# Patient Record
Sex: Male | Born: 1985 | Race: White | Hispanic: No | Marital: Single
Health system: Southern US, Community
[De-identification: ages and names within clinical notes are randomized; demographics above are authoritative.]

---

## 2014-05-13 ENCOUNTER — Emergency Department: Payer: Self-pay | Admitting: Emergency Medicine

## 2014-05-13 LAB — URINALYSIS, COMPLETE
Bilirubin,UR: NEGATIVE
Blood: NEGATIVE
GLUCOSE, UR: NEGATIVE mg/dL (ref 0–75)
KETONE: NEGATIVE
Leukocyte Esterase: NEGATIVE
NITRITE: NEGATIVE
PH: 6 (ref 4.5–8.0)
Protein: NEGATIVE
Specific Gravity: 1.002 (ref 1.003–1.030)
Squamous Epithelial: <1
WBC UR: NONE SEEN /HPF (ref 0–5)

## 2014-05-13 LAB — DRUG SCREEN, URINE

## 2014-05-13 LAB — SALICYLATE LEVEL: Salicylates, Serum: <1.7 mg/dL

## 2014-05-13 LAB — COMPREHENSIVE METABOLIC PANEL
ALBUMIN: 3.9 g/dL (ref 3.4–5.0)
ALK PHOS: 142 U/L — AB
ALT: 23 U/L
Anion Gap: 11 (ref 7–16)
BILIRUBIN TOTAL: 0.4 mg/dL (ref 0.2–1.0)
BUN: 7 mg/dL (ref 7–18)
Calcium, Total: 8.2 mg/dL — ABNORMAL LOW (ref 8.5–10.1)
Chloride: 104 mmol/L (ref 98–107)
Co2: 25 mmol/L (ref 21–32)
Creatinine: 0.8 mg/dL (ref 0.60–1.30)
EGFR (African American): >60
EGFR (Non-African Amer.): >60
Glucose: 87 mg/dL (ref 65–99)
Osmolality: 277 (ref 275–301)
Potassium: 3.4 mmol/L — ABNORMAL LOW (ref 3.5–5.1)
SGOT(AST): 33 U/L (ref 15–37)
Sodium: 140 mmol/L (ref 136–145)
TOTAL PROTEIN: 6.6 g/dL (ref 6.4–8.2)

## 2014-05-13 LAB — CBC
HCT: 47.4 % (ref 40.0–52.0)
HGB: 15.8 g/dL (ref 13.0–18.0)
MCH: 30.9 pg (ref 26.0–34.0)
MCHC: 33.3 g/dL (ref 32.0–36.0)
MCV: 93 fL (ref 80–100)
Platelet: 292 10*3/uL (ref 150–440)
RBC: 5.1 10*6/uL (ref 4.40–5.90)
RDW: 18.5 % — ABNORMAL HIGH (ref 11.5–14.5)
WBC: 7.2 10*3/uL (ref 3.8–10.6)

## 2014-05-13 LAB — ETHANOL: ETHANOL LVL: 100 mg/dL

## 2014-05-13 LAB — ACETAMINOPHEN LEVEL: Acetaminophen: 2 ug/mL — ABNORMAL LOW

## 2014-05-13 LAB — MAGNESIUM: Magnesium: 2.2 mg/dL

## 2014-05-13 LAB — LIPASE, BLOOD: LIPASE: 75 U/L (ref 73–393)

## 2014-05-15 ENCOUNTER — Inpatient Hospital Stay: Payer: Self-pay | Admitting: Psychiatry

## 2014-05-15 LAB — ETHANOL: ETHANOL LVL: 81 mg/dL

## 2014-05-15 LAB — LIPASE, BLOOD: LIPASE: 76 U/L (ref 73–393)

## 2014-05-29 ENCOUNTER — Emergency Department: Payer: Self-pay | Admitting: Internal Medicine

## 2014-05-29 LAB — URINALYSIS, COMPLETE
Bacteria: NONE SEEN
Bilirubin,UR: NEGATIVE
Blood: NEGATIVE
Glucose,UR: NEGATIVE mg/dL (ref 0–75)
KETONE: NEGATIVE
LEUKOCYTE ESTERASE: NEGATIVE
NITRITE: NEGATIVE
PH: 6 (ref 4.5–8.0)
Protein: NEGATIVE
RBC, UR: NONE SEEN /HPF (ref 0–5)
SPECIFIC GRAVITY: 1.003 (ref 1.003–1.030)
Squamous Epithelial: 1
WBC UR: NONE SEEN /HPF (ref 0–5)

## 2014-05-29 LAB — COMPREHENSIVE METABOLIC PANEL
ALBUMIN: 3.7 g/dL (ref 3.4–5.0)
ALK PHOS: 143 U/L — AB
ALT: 34 U/L
AST: 33 U/L (ref 15–37)
Anion Gap: 6 — ABNORMAL LOW (ref 7–16)
BILIRUBIN TOTAL: 0.2 mg/dL (ref 0.2–1.0)
BUN: 9 mg/dL (ref 7–18)
CHLORIDE: 101 mmol/L (ref 98–107)
Calcium, Total: 8.3 mg/dL — ABNORMAL LOW (ref 8.5–10.1)
Co2: 29 mmol/L (ref 21–32)
Creatinine: 0.94 mg/dL (ref 0.60–1.30)
Glucose: 83 mg/dL (ref 65–99)
Osmolality: 270 (ref 275–301)
Potassium: 4.2 mmol/L (ref 3.5–5.1)
Sodium: 136 mmol/L (ref 136–145)
Total Protein: 6.7 g/dL (ref 6.4–8.2)

## 2014-05-29 LAB — DRUG SCREEN, URINE
AMPHETAMINES, UR SCREEN: NEGATIVE (ref ?–1000)
BARBITURATES, UR SCREEN: NEGATIVE (ref ?–200)
Benzodiazepine, Ur Scrn: NEGATIVE (ref ?–200)
COCAINE METABOLITE, UR ~~LOC~~: NEGATIVE (ref ?–300)
Cannabinoid 50 Ng, Ur ~~LOC~~: NEGATIVE (ref ?–50)
MDMA (ECSTASY) UR SCREEN: NEGATIVE (ref ?–500)
Methadone, Ur Screen: NEGATIVE (ref ?–300)
Opiate, Ur Screen: NEGATIVE (ref ?–300)
Phencyclidine (PCP) Ur S: NEGATIVE (ref ?–25)
Tricyclic, Ur Screen: NEGATIVE (ref ?–1000)

## 2014-05-29 LAB — CBC
HCT: 47.3 % (ref 40.0–52.0)
HGB: 16 g/dL (ref 13.0–18.0)
MCH: 30.9 pg (ref 26.0–34.0)
MCHC: 33.7 g/dL (ref 32.0–36.0)
MCV: 92 fL (ref 80–100)
PLATELETS: 325 10*3/uL (ref 150–440)
RBC: 5.17 10*6/uL (ref 4.40–5.90)
RDW: 17.7 % — ABNORMAL HIGH (ref 11.5–14.5)
WBC: 10.6 10*3/uL (ref 3.8–10.6)

## 2014-05-29 LAB — ACETAMINOPHEN LEVEL: Acetaminophen: <2 ug/mL

## 2014-05-29 LAB — ETHANOL: Ethanol: 115 mg/dL

## 2014-05-29 LAB — SALICYLATE LEVEL: Salicylates, Serum: 1.7 mg/dL

## 2014-05-30 LAB — OCCULT BLOOD X 1 CARD TO LAB, STOOL: Occult Blood, Feces: NEGATIVE

## 2014-06-04 ENCOUNTER — Emergency Department: Payer: Self-pay | Admitting: Emergency Medicine

## 2014-06-09 LAB — COMPREHENSIVE METABOLIC PANEL
ALBUMIN: 4.4 g/dL (ref 3.4–5.0)
ANION GAP: 8 (ref 7–16)
Alkaline Phosphatase: 141 U/L — ABNORMAL HIGH
BILIRUBIN TOTAL: 0.5 mg/dL (ref 0.2–1.0)
BUN: 13 mg/dL (ref 7–18)
CALCIUM: 9.3 mg/dL (ref 8.5–10.1)
CREATININE: 0.93 mg/dL (ref 0.60–1.30)
Chloride: 100 mmol/L (ref 98–107)
Co2: 28 mmol/L (ref 21–32)
EGFR (Non-African Amer.): >60
GLUCOSE: 122 mg/dL — AB (ref 65–99)
OSMOLALITY: 273 (ref 275–301)
POTASSIUM: 3.8 mmol/L (ref 3.5–5.1)
SGOT(AST): 32 U/L (ref 15–37)
SGPT (ALT): 43 U/L
Sodium: 136 mmol/L (ref 136–145)
Total Protein: 7.9 g/dL (ref 6.4–8.2)

## 2014-06-09 LAB — DRUG SCREEN, URINE
AMPHETAMINES, UR SCREEN: NEGATIVE (ref ?–1000)
BARBITURATES, UR SCREEN: NEGATIVE (ref ?–200)
Benzodiazepine, Ur Scrn: NEGATIVE (ref ?–200)
Cannabinoid 50 Ng, Ur ~~LOC~~: NEGATIVE (ref ?–50)
Cocaine Metabolite,Ur ~~LOC~~: NEGATIVE (ref ?–300)
MDMA (ECSTASY) UR SCREEN: NEGATIVE (ref ?–500)
Methadone, Ur Screen: NEGATIVE (ref ?–300)
Opiate, Ur Screen: NEGATIVE (ref ?–300)
PHENCYCLIDINE (PCP) UR S: NEGATIVE (ref ?–25)
Tricyclic, Ur Screen: NEGATIVE (ref ?–1000)

## 2014-06-09 LAB — URINALYSIS, COMPLETE
BILIRUBIN, UR: NEGATIVE
Bacteria: NONE SEEN
Blood: NEGATIVE
Glucose,UR: NEGATIVE mg/dL (ref 0–75)
Hyaline Cast: 5
KETONE: NEGATIVE
LEUKOCYTE ESTERASE: NEGATIVE
Nitrite: NEGATIVE
Ph: 6 (ref 4.5–8.0)
Protein: 30
RBC,UR: <1 /HPF (ref 0–5)
SPECIFIC GRAVITY: 1.026 (ref 1.003–1.030)
Squamous Epithelial: 2
WBC UR: 1 /HPF (ref 0–5)

## 2014-06-09 LAB — CBC
HCT: 52 % (ref 40.0–52.0)
HGB: 17.7 g/dL (ref 13.0–18.0)
MCH: 30.8 pg (ref 26.0–34.0)
MCHC: 34.1 g/dL (ref 32.0–36.0)
MCV: 90 fL (ref 80–100)
Platelet: 368 10*3/uL (ref 150–440)
RBC: 5.76 10*6/uL (ref 4.40–5.90)
RDW: 16.6 % — AB (ref 11.5–14.5)
WBC: 8.8 10*3/uL (ref 3.8–10.6)

## 2014-06-09 LAB — LIPASE, BLOOD: Lipase: 80 U/L (ref 73–393)

## 2014-06-09 LAB — SALICYLATE LEVEL: Salicylates, Serum: <1.7 mg/dL

## 2014-06-09 LAB — ETHANOL: ETHANOL LVL: 106 mg/dL

## 2014-06-09 LAB — ACETAMINOPHEN LEVEL

## 2014-06-10 ENCOUNTER — Inpatient Hospital Stay: Payer: Self-pay | Admitting: Psychiatry

## 2014-11-02 NOTE — Discharge Summary (Signed)
PATIENT NAME:  Henry Johnston, Henry Johnston MR#:  [id number] DATE OF BIRTH:  [birth year]  DATE OF ADMISSION:  05/15/2014 DATE OF DISCHARGE:  05/20/2014  IDENTIFYING INFORMATION: The patient is a 29 year old single Caucasian male on disability who currently lives in [place], [place].   CHIEF COMPLAINT: "I had nausea and vomiting for 7 days."   DISCHARGE DIAGNOSES:  1.  Alcohol use disorder, severe.  2.  Alcohol withdrawal without perceptual disturbances.  3.  Hypertension. 4.  Corneal abrasion.   5.  Gastroesophageal reflux disease.   6.Gastric ulcer  DISCHARGE MEDICATIONS: Lisinopril 10 mg p.o. daily, Protonix 40 mg p.o. b.i.d., and Zyrtec 10 mg p.o. daily for allergies.    HOSPITAL COURSE: The patient presented to the Emergency Department on November 4 due to nausea and vomiting. He reported that he had consumed large amounts of alcohol for 7 days in a row and was having nonstop nausea and vomiting. The patient reported a long history of alcohol dependence. He reported abusing alcohol heavily for the last 3 years. The patient has not had any significant episodes of abstinence in 3 years, he usually drinks about a 12 pack of beer a day plus liquor up to 5 pints of liquor a day. The patient was admitted to [facility] unit in order to address the high alcohol withdrawal and prevent medical complications. He was started on a benzodiazepine taper. CIWA scores were checked every 8 hours and his vital signs were checked every 8 hours. The patient had an uncomplicated alcohol detoxification. The patient did not have any episodes of nausea and vomiting during his stay in the unit. Appetite, energy, and concentration were stable. Mood was euthymic. The patient at no time reported suicidality, homicidality, or psychosis. This was an uneventful hospitalization. The patient did not have any behavioral problems during his stay. There were no medical complications reported. On the day of the discharge  the patient reported euthymic mood. Denied  suicidality, homicidality, or psychosis. He denied problems with sleep, appetite, energy, or concentration. He denied having any physical complaints and denied having any side effects from his medications.   CONSULTATIONS:  At arrival the patient reported having a fall. Head CT reported no abnormalities, however he did appear to have a corneal abrasion. Ophthalmology was consulted and they recommended for the patient to have ophthalmic neomycin, which he completed during his stay in the inpatient hospital. No other recommendations were given.   MENTAL STATUS EXAMINATION: The patient is a 29 year old Caucasian male who appears his stated age. He displays fair grooming and hygiene. Behavior, he was pleasant and cooperative. Eye contact was within normal range. Psychomotor activity was within normal range. His speech had regular tone, volume, and rate. Thought process is linear. Thought content negative for suicidality, homicidality, or psychosis. Mood euthymic, affect reactive. Insight and judgment fair. Attention and concentration appear to be average, however it was not formally tested. Fund of knowledge appears to be average.  Cognitive examination, the patient is alert and oriented in person, place, time, and situation.   LABORATORY RESULTS: BUN 7, creatinine 0.8, sodium 140, potassium 3.4, calcium 8.2. Lipase 76. AST 33, ALT 23. Alcohol at admission was 100. Urine toxicology screen was negative. WBC 7.2, hemoglobin 15.9, hematocrit 47.4, platelet count 292,000.  UA clear. Acetaminophen and salicylate levels were below detection limit.   DISCHARGE DISPOSITION: The patient will return to his home where he lives with a friend and his friend's father in [place].   DISCHARGE FOLLOWUP: The  patient will be set up to follow up with [facility] and he will start receiving CST through [facility]. He has an appointment with them on 05/22/2014 at 7:00 a.m., fax number  [phone] and telephone number [phone].     ____________________________ Jimmy Footman, MD ahg:bu D: 05/20/2014 16:17:56 ET T: 05/20/2014 18:43:43 ET JOB#: [id number]  cc: Jimmy Footman, MD, <Dictator> Horton Chin MD ELECTRONICALLY SIGNED 05/22/2014 14:43
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
## 2014-11-02 NOTE — Consult Note (Signed)
Brief Consult Note: Diagnosis: alcohol abuse.   Patient was seen by consultant.   Consult note dictated.   Orders entered.   Comments: Psychiatry: Patient seen. Chart reviewed. Patient with alcohol abuse agagin drinking heavily with medical complications. [facility] sugggests [facility]. Will start detox orders and we will refer him to [facility].  Electronic Signatures: Audery Amellapacs, Zamir Staples T (MD)  (Signed [phone]-Nov-15 12:45)  Authored: Brief Consult Note   Last Updated: 19-Nov-15 12:45 by Audery Amellapacs, Maddison Kilner T (MD)

## 2014-11-02 NOTE — Consult Note (Signed)
PATIENT NAME:  Henry Johnston, Henry Johnston MR#:  [id number] DATE OF BIRTH:  Nov 14, 1985  DATE OF CONSULTATION:  05/15/2014  REFERRING PHYSICIAN:   CONSULTING PHYSICIAN:  Mirely Pangle H. Allena Katz, MD  PRIMARY CARE PROVIDER: None.   REFERRING PHYSICIAN:  Consult requested by Dr. Toni Amend.    REASON FOR CONSULTATION: Accelerated hypertension.   HISTORY OF PRESENT ILLNESS: The patient is a 29 year old white male who was admitted for alcohol abuse to the behavioral medicine unit. He does have a history of hypertension, according to him he has had a blood pressure all of his life. He was on HCTZ which he stopped a few months back, who was noted to have blood pressure initially of 159/126, but he has received a dose of lisinopril and the blood pressure currently is 139/94. The patient otherwise denies any headaches. No chest pains or shortness of breath. The patient also fell on concrete yesterday on the left side of his face and he has been having tearing from his left eye. He denies any pain in the eye. Denies any fevers or chills. No nausea, vomiting, or diarrhea.   PAST MEDICAL HISTORY:  1.  Significant for fatty liver.  2.  History of hypertension.  3.  GERD.   PAST SURGICAL HISTORY: None.   ALLERGIES: None.   HOME MEDICATIONS:  Pepcid 20 mg 1 tab p.o. b.i.d., Zofran 4 mg 3 times a day as needed.   SOCIAL HISTORY: Does not smoke. Drinks about 6 packs of beer daily. No drug use.   FAMILY HISTORY: Positive for hypertension. Positive for alcohol abuse.   REVIEW OF SYSTEMS:  CONSTITUTIONAL: Denies any fevers, fatigue. There is no weight loss, no weight gain. EYES: Complains of left eye redness and drainage since this fall yesterday. No glaucoma. No cataracts.  EARS, NOSE, AND THROAT: No tinnitus. No ear pain. No hearing loss. No seasonal or year-round allergies. No epistaxis. No nasal discharge. No difficulty swallowing.  RESPIRATORY: Denies any cough, wheezing, hemoptysis. No COPD, no TB.   CARDIOVASCULAR:  Denies any chest pain, orthopnea, edema, or arrhythmia.  GASTROINTESTINAL: No nausea, vomiting, diarrhea. No abdominal pain. No hematemesis. No melena. No ulcer.  Has GERD. No IBS. No jaundice.  GENITOURINARY: Denies any dysuria, hematuria, renal calculus, or frequency.  ENDOCRINE: Denies any polyuria, nocturia, or thyroid problems.  HEMATOLOGIC AND LYMPHATIC: Denies anemia, easy bruisability, or bleeding.  SKIN: No acne. No rash.  MUSCULOSKELETAL: Denies any pain in the neck, back, or shoulder.  NEUROLOGIC: No numbness, CVA, TIA, or seizures.  PSYCHIATRIC: Denies any anxiety, insomnia, or ADD.   PHYSICAL EXAMINATION:  VITAL SIGNS: Temperature 98.1, pulse 96, respirations 18, blood pressure 139/94.  GENERAL: The patient is a well-developed male in no acute distress.  HEENT: Head atraumatic, normocephalic. Pupils equally round, reactive to light and accommodation.  The left eye has got erythema and watery drainage. Nasal exam shows no drainage or ulceration. Oropharynx is clear without any exudate.  NECK: Supple without any thyromegaly.  CARDIOVASCULAR: Regular rate and rhythm. No murmurs, rubs, clicks, or gallops.  LUNGS: Clear to auscultation bilaterally without any rales, rhonchi, wheezing.  ABDOMEN: Soft, nontender, nondistended. Positive bowel sounds x 4.  EXTREMITIES: No clubbing, cyanosis, edema.  SKIN: No rash.  LYMPHATICS: No lymph nodes palpable.  VASCULAR: Good DP, PT pulses.  PSYCHIATRIC: Not anxious or depressed.   NEUROLOGIC:  Awake, alert, oriented x 3. No focal deficits.    LABORATORY DATA:  Done on November 2, glucose 87, BUN 7, creatinine 0.80, sodium 140, potassium 3.4,  chloride 104, CO2 of 25, calcium 8.2. LFTs were normal except alkaline phosphatase of 142. Toxic urine drug screen was negative. WBC 7.2, hemoglobin 15.8, platelet count 292,000. CT scan of the head without contrast shows no acute intracranial abnormalities.  ASSESSMENT AND PLAN: The patient is a  29 year old white male with history of alcohol abuse, hypertension, GERD, admitted for detox.   1.  Accelerated hypertension. Blood pressure is currently improved. Continue lisinopril and p.r.n. clonidine. We will adjust his medications as needed.  2.  Left eye injury with possible corneal abrasion. Ophthalmology consult and further treatment based on that.  3.  Gastroesophageal reflux disease. We will continue on Protonix.  4.  Miscellaneous. The patient is ambulatory.    TIME SPENT: 45 minutes on this consult.     ____________________________ Lacie ScottsShreyang H. Allena KatzPatel, MD shp:bu D: 05/15/2014 14:20:57 ET T: 05/15/2014 14:34:15 ET JOB#: [id number]  cc: Khaniyah Bezek H. Allena KatzPatel, MD, <Dictator> Charise CarwinSHREYANG H Andelyn Spade MD ELECTRONICALLY SIGNED 05/29/2014 19:26

## 2014-11-02 NOTE — Consult Note (Signed)
PATIENT NAME:  Henry Johnston, [name] MR#:  [id number] DATE OF BIRTH:  10/15/1985  DATE OF CONSULTATION:  05/15/2014  REFERRING PHYSICIAN:   CONSULTING PHYSICIAN:  Audery AmelJohn T. Seniyah Esker, MD  IDENTIFYING INFORMATION AND REASON FOR CONSULT: A 29 year old man with a history of alcohol abuse, comes to the hospital.   CHIEF COMPLAINT: "I've been throwing up from alcohol."   HISTORY OF PRESENT ILLNESS: Information obtained from the patient and the chart. This is the second time in a week he has come to the Emergency Room with a similar complaint. He said that he had been sick to his stomach and throwing up for several days. The nausea has subsided, but he says that it is because he has been drinking heavily. He estimates about a 6-pack of beer a day and that he has been doing that for several years. He did have a detoxification episode in August at [facility], but did not stay sober afterwards. He does not have a history of seizures. Does not have a history of DTs. Does get shaky when he stops drinking. Mood has been somewhat down and dysphoric, but he denies any suicidal ideation. No other major stressor identified.   PAST PSYCHIATRIC HISTORY: Only psychiatric treatment that he identifies was being in [facility] for detoxification in August of this year. Otherwise, has never seen a psychiatrist, never been in a hospital. No history of suicide attempts.   SUBSTANCE ABUSE HISTORY: Years of heavy drinking without any sustained sobriety that he reports. No history of seizures. No history of DTs. Denies that he abuses any other drugs.   SOCIAL HISTORY: The patient gets disability. Says he lives with some friends. Does not work. Does not do much.   PAST MEDICAL HISTORY: Apparently has some kind of back pain for which he has gotten disability. Also has high blood pressure. Also has acid reflux.   MEDICATIONS: Does not take any medicine.   ALLERGIES: No known drug allergies.   SUBSTANCE ABUSE HISTORY: As noted above.    FAMILY HISTORY: Says he has an extensive family history of substance abuse.   REVIEW OF SYSTEMS: Feeling shaky and sick to his stomach. Mildly dysphoric. No suicidal ideation. No hallucinations. The rest of the review of systems all negative.   MENTAL STATUS EXAMINATION: Disheveled gentleman who looks his stated age, cooperative with the interview. Eye contact good. Psychomotor activity a little bit shaky. Speech is decreased in total amount and quiet. Affect is blunted. Mood stated as being nervous. Thoughts are lucid, but slow. No evidence of loosening of associations or delusions. Denies auditory or visual hallucinations. Denies suicidal or homicidal ideation. He is alert and oriented x4. Remembers 3/3 objects immediately and at 3 minutes. Judgment and insight adequate. Baseline intelligence and fund of knowledge normal.   LABORATORY RESULTS: CT of the head done because of a recent fall. Result is no acute finding.   Alcohol level was 81 on presentation.   Lipase normal. The chemistry panel is normal except for a slightly low potassium at 3.4. CBC unremarkable. Urinalysis normal. Drug screen negative.   VITAL SIGNS: Blood pressure currently 158/108, respirations 18, pulse 96, temperature 98.   ASSESSMENT: A 29 year old man with alcohol dependence, requesting detoxification. Elevated vital signs. Repeated attempts to try and stop outpatient without success. The patient is reasonable for admission to the hospital for safety for alcohol withdrawal.   TREATMENT PLAN: Admit voluntarily. Suicide and fall precautions and seizure precautions. Order detoxification medicines. I will start him on a low  dose of lisinopril for his blood pressure. We can order a medicine consult for his blood pressure. Engage him in groups with further decisions made by the floor.   DIAGNOSIS, PRINCIPAL AND PRIMARY:  AXIS I: Alcohol abuse, severe.   SECONDARY DIAGNOSES:  AXIS I: No further.  AXIS II: Deferred.  AXIS  III: High blood pressure, history of chronic back pain.   ____________________________ Audery Amel, MD jtc:JT D: 05/15/2014 13:24:42 ET T: 05/15/2014 13:40:44 ET JOB#: [id number]  cc: Audery Amel, MD, <Dictator> Audery Amel MD ELECTRONICALLY SIGNED 05/18/2014 14:59

## 2014-11-02 NOTE — Consult Note (Signed)
PATIENT NAME:  Henry Johnston, Henry Johnston MR#:  [id number] DATE OF BIRTH:  1986/06/08  DATE OF CONSULTATION:  05/30/2014  REFERRING PHYSICIAN:   CONSULTING PHYSICIAN:  Audery Amel, MD  IDENTIFYING INFORMATION AND REASON FOR CONSULTATION: A 29 year old man who came to the Emergency Room requesting detox. Consultation because he is not eligible to go to [facility].   HISTORY OF PRESENT ILLNESS: Information obtained from the patient and the chart. This 29 year old man comes in stating that he has been drinking heavily every day. He was unreliable in giving the amount that he was drinking. He told me initially that he had only been drinking 2 beers yesterday and that he drank an average of a 6 pack most days. He then turned around and told me that yesterday he had had much more than that and at one point told me he was drinking a 12 pack of beer a day. He denies that he is abusing any other drugs. He was just in the hospital earlier this month and was discharged just 9 days ago after a stay for detox and alcohol abuse. The patient says that he did not stay sober for even a single day after leaving the hospital. He felt like his cravings were too strong and he had to go back to drinking. Drinking causes him to have nausea and vomiting, which is his primary complaint about it. He says that when he is drinking he continues to vomit and has not been able to eat anything for days. He reports that he was having some blood in his vomiting earlier. He does not describe himself as depressed and denies suicidal ideation and denies any psychotic symptoms. He describes his mood as being "moody."   PAST PSYCHIATRIC HISTORY: Just discharged from the hospital 9 days ago after detox. He is a known client at [facility], but has not been following up there. He did not stay sober for even a single day after discharge. The patient is on disability for a diagnosis of what he calls "bipolar disorder and schizophrenia". He is unable to describe any  symptoms consistent with that and has not been on any medication for them in as long as he can remember. No history of suicide attempts. No history that he reports of violence. He has been hospitalized a few times over the last couple of years it sounds like, probably at [facility] and at [facility], but he says all of those were about his drinking.   SUBSTANCE ABUSE HISTORY: Says he has been drinking heavily for about 3 years. Claims he has not been able to stay sober for even a single day outside the hospital. He gets too shaky and tremulous. He talks about being afraid that he is going to have a "seizure", but he is not describing ever having actually had seizures or delirium tremens. Denies that he abuses any other drugs.   PAST MEDICAL HISTORY: The patient apparently has been given a diagnosis of an ulcer and takes Protonix regularly, also takes lisinopril for blood pressure.   FAMILY HISTORY: Reports that his mother has an alcohol problem and he has a brother who also has chronic mental illness.   SOCIAL HISTORY: The patient gets disability. Does not work. Lives with some family friends who nevertheless seem to look after him pretty closely. He says that he is pretty much estranged from his family.   REVIEW OF SYSTEMS: Still feeling sick to his stomach, but has not been vomiting today. Not feeling depressed. Not  feeling suicidal. No hallucinations. No delusions. No other specific complaints.   MENTAL STATUS EXAMINATION: Adequately groomed man who looks his stated age, cooperative with the interview. He is very passive in his interaction. Answers questions very simply and often in a stereotyped manner. Eye contact decreased. Psychomotor activity slow. Speech decreased in total amount. Affect is somewhat blunted. Mood stated as being moody. Thoughts appear to be slow and a bit concrete. No evidence of delusional thinking. Denies hallucinations. Denies suicidal or homicidal ideation. Can remember 3  out of 3 objects immediately and at 3 minutes. He is alert and oriented x4. Judgment and insight at least at the moment seems to be adequate. Baseline intelligence and fund of knowledge appears to be in the normal range.   LABORATORY RESULTS: Salicylates and acetaminophen negative. Alcohol level was 115. Calcium level low at 8.3. Elevated alkaline phosphatase 143. Other liver enzymes unremarkable. CBC all normal. Urinalysis unremarkable. Drug screen negative.   VITAL SIGNS: His blood pressure is currently 132/96, pulse 77, respirations 18, temperature 97.8.   ASSESSMENT: A [age] year old man with alcohol abuse, severe. Not a very good historian, but it sounds like he has been trying to stop drinking for a while because of his stomach problems, but has not been able to do it outpatient and is not really following up with outpatient treatment. The patient is requesting further detox and substance abuse treatment. We have gotten a communication from the liaison from [facility] that they are [facility] because of his failure to follow up with outpatient treatment. He apparently really does have this chronic nausea and vomiting problem and they are afraid for his health.   TREATMENT PLAN: The patient is on detoxification orders right now. Other than detoxification, not likely to benefit from any specific treatment inside our hospital. We are going to [facility] and try and see if we can get him transported there. The patient understands and is agreeable.   DIAGNOSIS, PRINCIPAL AND PRIMARY:  AXIS I: Alcohol abuse, severe.   SECONDARY DIAGNOSES: AXIS I: History of bipolar disorder by past history.  AXIS II: Deferred.  AXIS III: High blood pressure, history of gastric reflux or possible peptic ulcer. ____________________________ Audery AmelJohn T. Jamarie Mussa, MD jtc:sb D: 05/30/2014 13:37:49 ET T: 05/30/2014 13:48:22  ET JOB#: [id number]  cc: Audery AmelJohn T. Verne Cove, MD, <Dictator> Audery AmelJOHN T Leigha Olberding MD ELECTRONICALLY SIGNED 06/21/2014 19:08
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
## 2016-02-01 IMAGING — CT CT ABD-PELV W/ CM
2 of 4 series · 16 of 46 positions shown, 18 images · IV contrast (isovue)
Comparison: None.

CLINICAL DATA: Generalized abdominal pain for 5 days.

EXAM:
CT ABDOMEN AND PELVIS WITH CONTRAST
TECHNIQUE: Multidetector CT imaging of the abdomen and pelvis was performed
using the standard protocol following bolus administration of
intravenous contrast.
CONTRAST:  100 cc Isovue 300

[Series 2: routine abd pel with · axial · 0.78mm/px · z∈[-507,-27]mm · 13 of 106 slices shown, 15 images]
[im 5/106  soft-tissue]
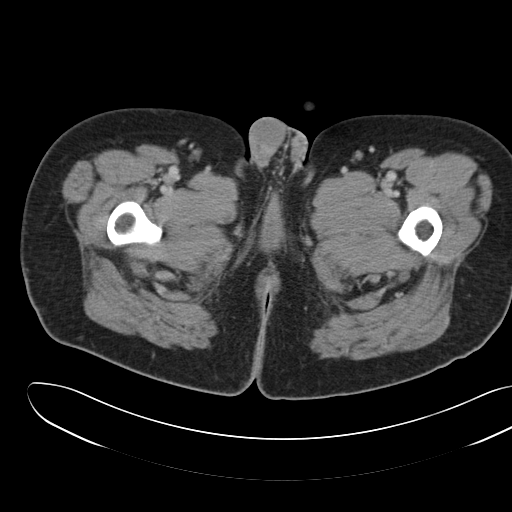
[im 5/106  bone]
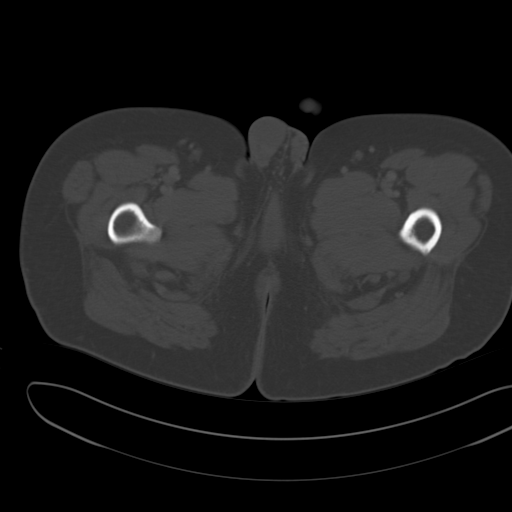
[im 13/106  soft-tissue]
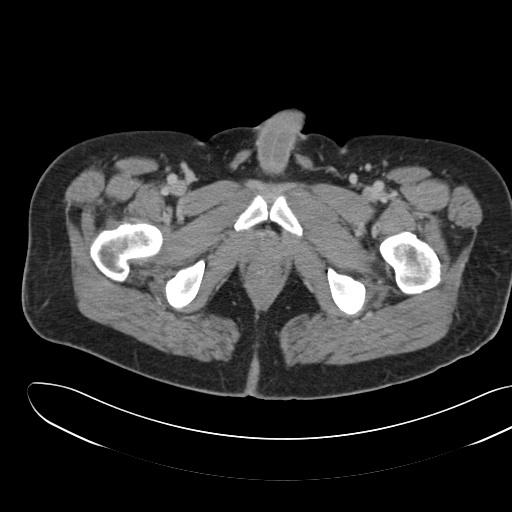
[im 22/106  soft-tissue]
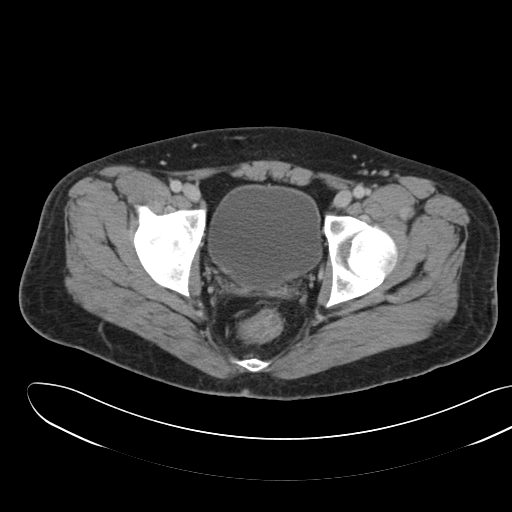
[im 30/106  soft-tissue]
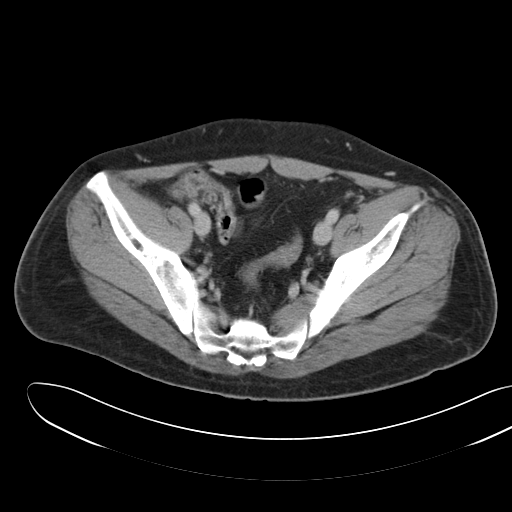
[im 38/106  soft-tissue]
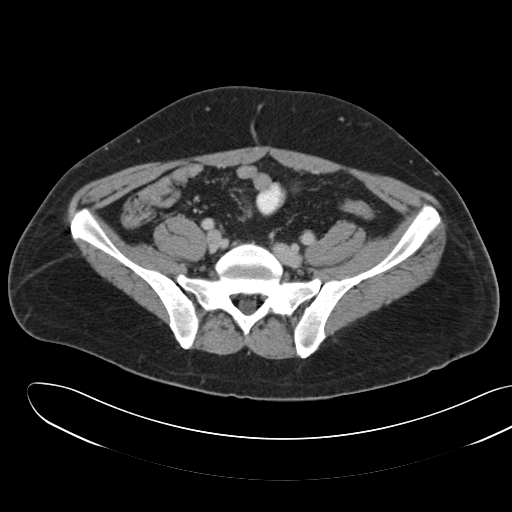
[im 47/106  soft-tissue]
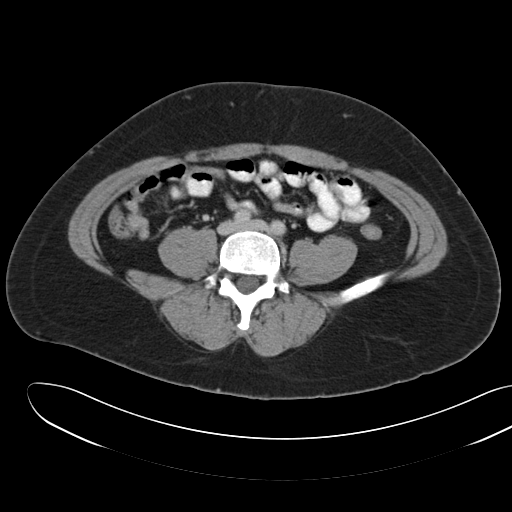
[im 55/106  soft-tissue]
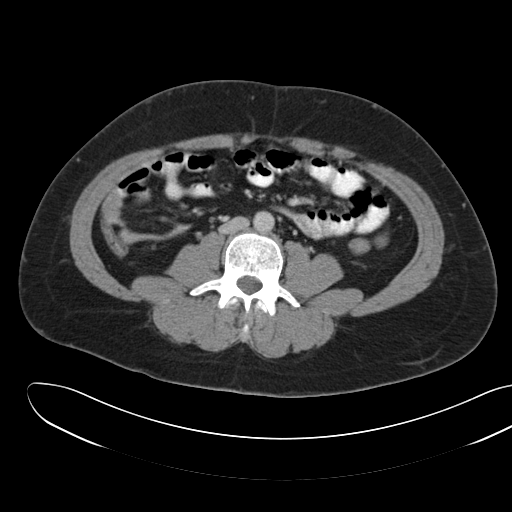
[im 59/106  soft-tissue]
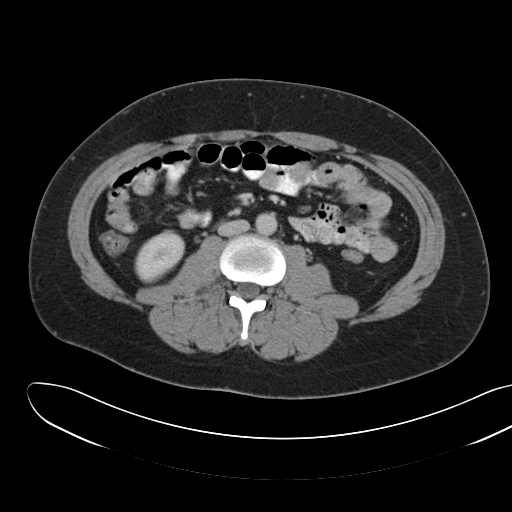
[im 68/106  soft-tissue]
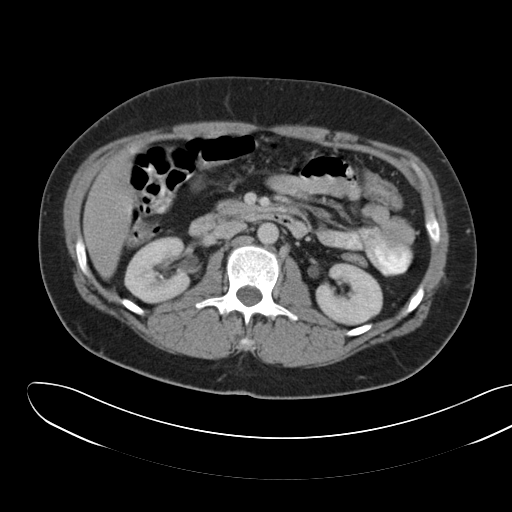
[im 68/106  bone]
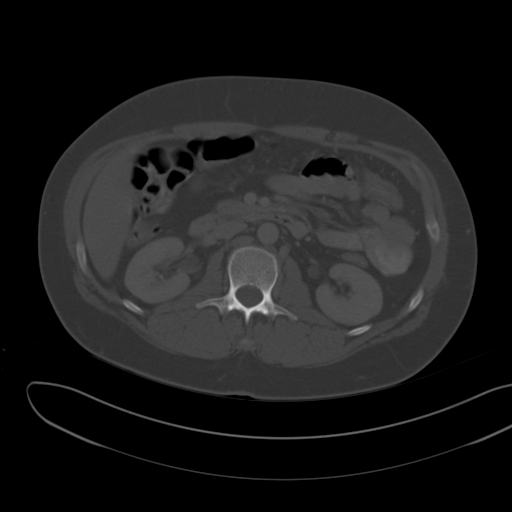
[im 76/106  soft-tissue]
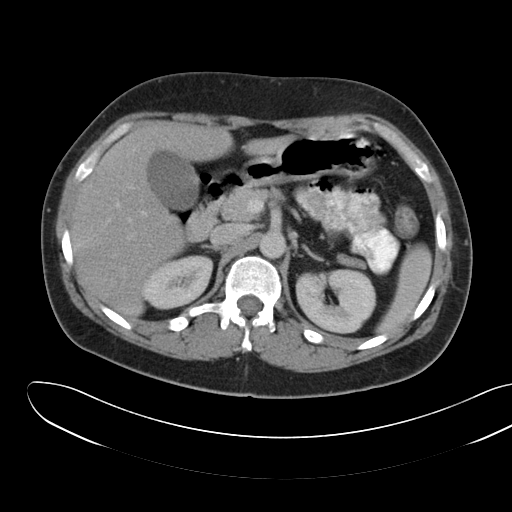
[im 85/106  soft-tissue]
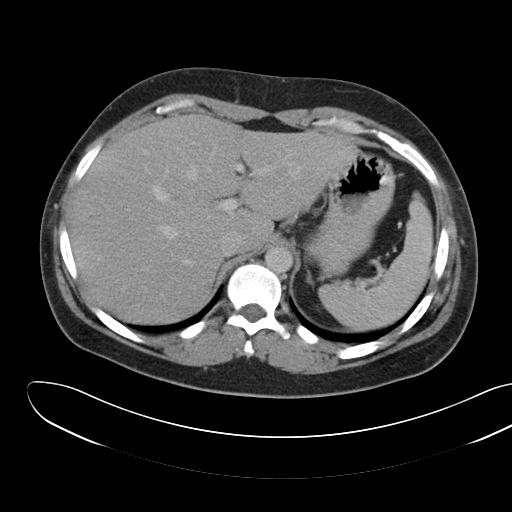
[im 93/106  soft-tissue]
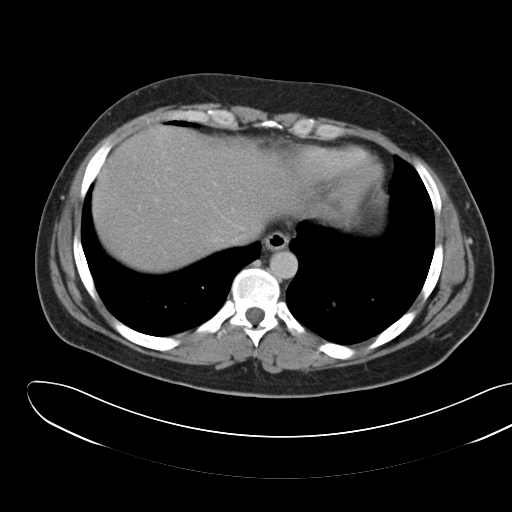
[im 101/106  soft-tissue]
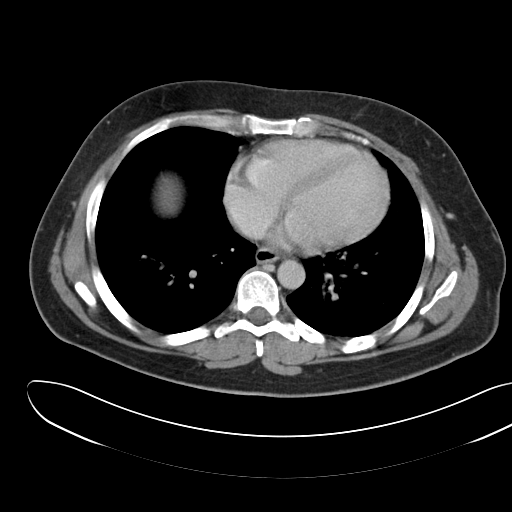

[Series 5: cor routine abd pel with · coronal · 0.72mm/px · 3 of 112 slices shown]
[im 38/112  soft-tissue]
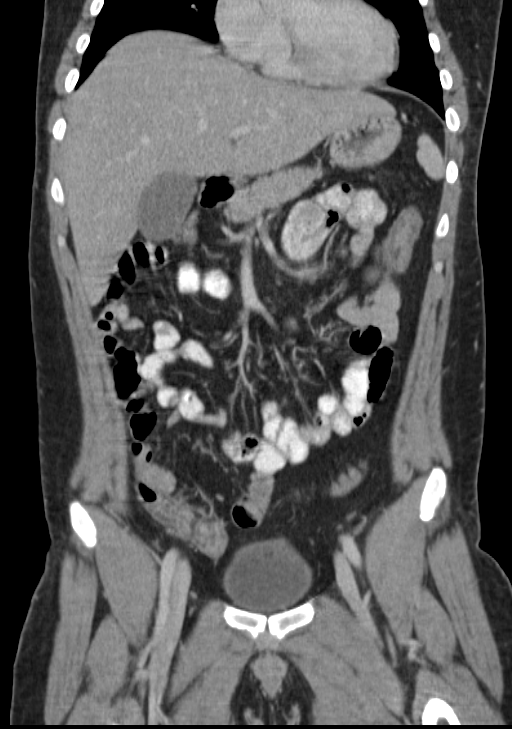
[im 50/112  soft-tissue]
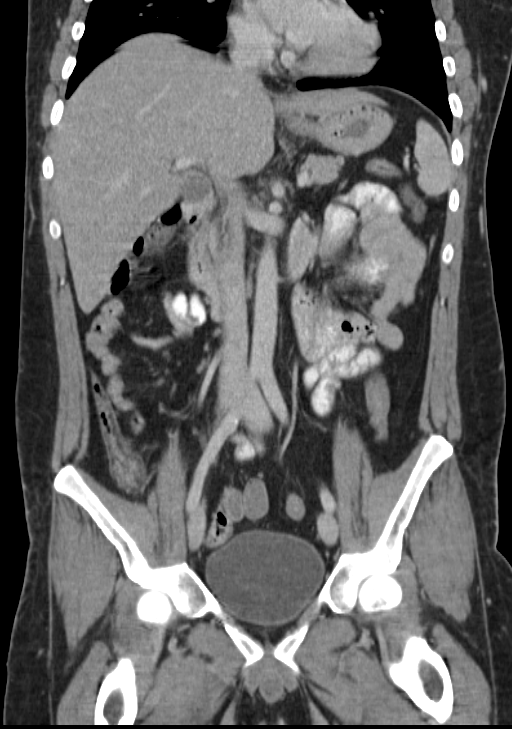
[im 62/112  soft-tissue]
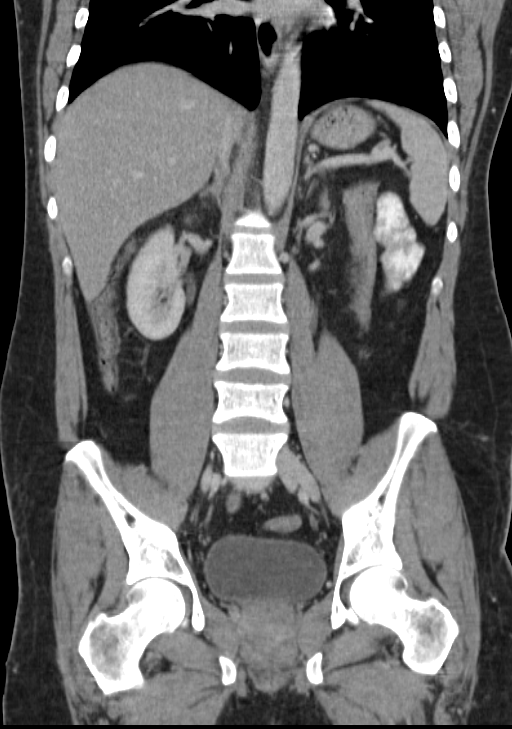

[16 of 46 positions shown; findings below may reference images not displayed]

FINDINGS: Lower chest: The lung bases are clear of acute process. No pleural
effusion or pulmonary lesions. The heart is normal in size. No
pericardial effusion. The distal esophagus and aorta are
unremarkable.

Hepatobiliary: Diffuse fatty infiltration of the liver but no focal
hepatic lesions or intrahepatic biliary dilatation. The gallbladder
is normal. No common bile duct dilatation.

Pancreas: Normal.

Spleen: Normal.

Adrenals/Urinary Tract: Normal except for a small right renal
calculus.

Stomach/Bowel: The stomach, duodenum, small bowel and colon are
unremarkable. No inflammatory changes, mass lesions or obstructive
findings. The terminal ileum is unremarkable. The appendix is
normal.

Vascular/Lymphatic: No mesenteric or retroperitoneal mass or
adenopathy. The aorta and branch vessels are normal. The major
venous structures are patent.

Pelvis: The bladder, prostate gland and seminal vesicles are
unremarkable. No pelvic mass, adenopathy or free pelvic fluid
collections. No inguinal mass or adenopathy.

Musculoskeletal: No significant findings.
IMPRESSION: No acute abdominal/pelvic findings, mass lesions or lymphadenopathy.
